# Patient Record
Sex: Male | Born: 1940
Health system: Southern US, Community
[De-identification: ages and names within clinical notes are randomized; demographics above are authoritative.]

## PROBLEM LIST (undated history)

## (undated) DIAGNOSIS — R55 Syncope and collapse: Secondary | ICD-10-CM

## (undated) DIAGNOSIS — N183 Chronic kidney disease, stage 3 unspecified: Secondary | ICD-10-CM

## (undated) DIAGNOSIS — K4090 Unilateral inguinal hernia, without obstruction or gangrene, not specified as recurrent: Secondary | ICD-10-CM

## (undated) DIAGNOSIS — R079 Chest pain, unspecified: Secondary | ICD-10-CM

## (undated) DIAGNOSIS — H269 Unspecified cataract: Secondary | ICD-10-CM

## (undated) DIAGNOSIS — E785 Hyperlipidemia, unspecified: Secondary | ICD-10-CM

## (undated) DIAGNOSIS — R001 Bradycardia, unspecified: Secondary | ICD-10-CM

## (undated) HISTORY — DX: Unspecified cataract: H26.9

## (undated) HISTORY — DX: Syncope and collapse: R55

## (undated) HISTORY — DX: Chest pain, unspecified: R07.9

## (undated) HISTORY — PX: TONSILLECTOMY: SUR1361

## (undated) HISTORY — DX: Unilateral inguinal hernia, without obstruction or gangrene, not specified as recurrent: K40.90

## (undated) HISTORY — PX: HERNIA REPAIR: SHX51

## (undated) HISTORY — DX: Hyperlipidemia, unspecified: E78.5

## (undated) HISTORY — PX: ROTATOR CUFF REPAIR: SHX139

## (undated) HISTORY — DX: Bradycardia, unspecified: R00.1

## (undated) HISTORY — PX: CATARACT EXTRACTION, BILATERAL: SHX1313

## (undated) HISTORY — DX: Chronic kidney disease, stage 3 unspecified: N18.30

## (undated) HISTORY — DX: Chronic kidney disease, stage 3 (moderate): N18.3

---

## 2005-04-03 ENCOUNTER — Encounter: Admission: RE | Admit: 2005-04-03 | Discharge: 2005-04-03 | Payer: Self-pay | Admitting: Internal Medicine

## 2005-04-18 ENCOUNTER — Ambulatory Visit (HOSPITAL_COMMUNITY): Admission: RE | Admit: 2005-04-18 | Discharge: 2005-04-18 | Payer: Self-pay | Admitting: Gastroenterology

## 2005-06-26 ENCOUNTER — Emergency Department (HOSPITAL_COMMUNITY): Admission: EM | Admit: 2005-06-26 | Discharge: 2005-06-26 | Payer: Self-pay | Admitting: Family Medicine

## 2009-05-05 ENCOUNTER — Ambulatory Visit (HOSPITAL_BASED_OUTPATIENT_CLINIC_OR_DEPARTMENT_OTHER): Admission: RE | Admit: 2009-05-05 | Discharge: 2009-05-05 | Payer: Self-pay | Admitting: Internal Medicine

## 2009-05-05 ENCOUNTER — Ambulatory Visit: Payer: Self-pay | Admitting: Interventional Radiology

## 2009-05-05 ENCOUNTER — Encounter: Payer: Self-pay | Admitting: Cardiology

## 2009-05-05 ENCOUNTER — Ambulatory Visit: Payer: Self-pay | Admitting: Internal Medicine

## 2009-05-05 DIAGNOSIS — R071 Chest pain on breathing: Secondary | ICD-10-CM | POA: Insufficient documentation

## 2009-05-05 DIAGNOSIS — R5381 Other malaise: Secondary | ICD-10-CM

## 2009-05-05 DIAGNOSIS — K219 Gastro-esophageal reflux disease without esophagitis: Secondary | ICD-10-CM

## 2009-05-05 DIAGNOSIS — E785 Hyperlipidemia, unspecified: Secondary | ICD-10-CM | POA: Insufficient documentation

## 2009-05-05 DIAGNOSIS — R5383 Other fatigue: Secondary | ICD-10-CM

## 2009-05-05 DIAGNOSIS — E039 Hypothyroidism, unspecified: Secondary | ICD-10-CM | POA: Insufficient documentation

## 2009-05-05 LAB — CONVERTED CEMR LAB
ALT: 19 units/L (ref 0–53)
AST: 18 units/L (ref 0–37)
Albumin: 4.8 g/dL (ref 3.5–5.2)
Alkaline Phosphatase: 29 units/L — ABNORMAL LOW (ref 39–117)
BUN: 22 mg/dL (ref 6–23)
Basophils Absolute: 0 10*3/uL (ref 0.0–0.1)
Basophils Relative: 1 % (ref 0–1)
Bilirubin, Direct: 0.2 mg/dL (ref 0.0–0.3)
CO2: 20 meq/L (ref 19–32)
Calcium: 9.9 mg/dL (ref 8.4–10.5)
Chloride: 104 meq/L (ref 96–112)
Cholesterol: 180 mg/dL (ref 0–200)
Creatinine, Ser: 1.36 mg/dL (ref 0.40–1.50)
Eosinophils Absolute: 0.1 10*3/uL (ref 0.0–0.7)
Eosinophils Relative: 1 % (ref 0–5)
Free T4: 1.26 ng/dL (ref 0.80–1.80)
Glucose, Bld: 93 mg/dL (ref 70–99)
HCT: 49.1 % (ref 39.0–52.0)
HDL: 29 mg/dL — ABNORMAL LOW (ref >39)
Hemoglobin: 16.5 g/dL (ref 13.0–17.0)
Indirect Bilirubin: 0.9 mg/dL (ref 0.0–0.9)
LDL Cholesterol: 91 mg/dL (ref 0–99)
Lymphocytes Relative: 30 % (ref 12–46)
Lymphs Abs: 1.4 10*3/uL (ref 0.7–4.0)
MCHC: 33.6 g/dL (ref 30.0–36.0)
MCV: 96.1 fL (ref 78.0–100.0)
Monocytes Absolute: 0.5 10*3/uL (ref 0.1–1.0)
Monocytes Relative: 11 % (ref 3–12)
Neutro Abs: 2.6 10*3/uL (ref 1.7–7.7)
Neutrophils Relative %: 57 % (ref 43–77)
Platelets: 260 10*3/uL (ref 150–400)
Potassium: 4.5 meq/L (ref 3.5–5.3)
RBC: 5.11 M/uL (ref 4.22–5.81)
RDW: 12.7 % (ref 11.5–15.5)
Sodium: 141 meq/L (ref 135–145)
TSH: 3.457 microintl units/mL (ref 0.350–4.500)
Total Bilirubin: 1.1 mg/dL (ref 0.3–1.2)
Total CHOL/HDL Ratio: 6.2
Total Protein: 7.3 g/dL (ref 6.0–8.3)
Triglycerides: 298 mg/dL — ABNORMAL HIGH (ref <150)
VLDL: 60 mg/dL — ABNORMAL HIGH (ref 0–40)
Vitamin B-12: 452 pg/mL (ref 211–911)
WBC: 4.6 10*3/uL (ref 4.0–10.5)

## 2009-05-05 IMAGING — CR DG CHEST 2V
2 series · 2 of 2 positions shown · non-contrast
Comparison: [DATE] sick

CLINICAL DATA: Chest pain

CHEST - 2 VIEW

[w chest pa]
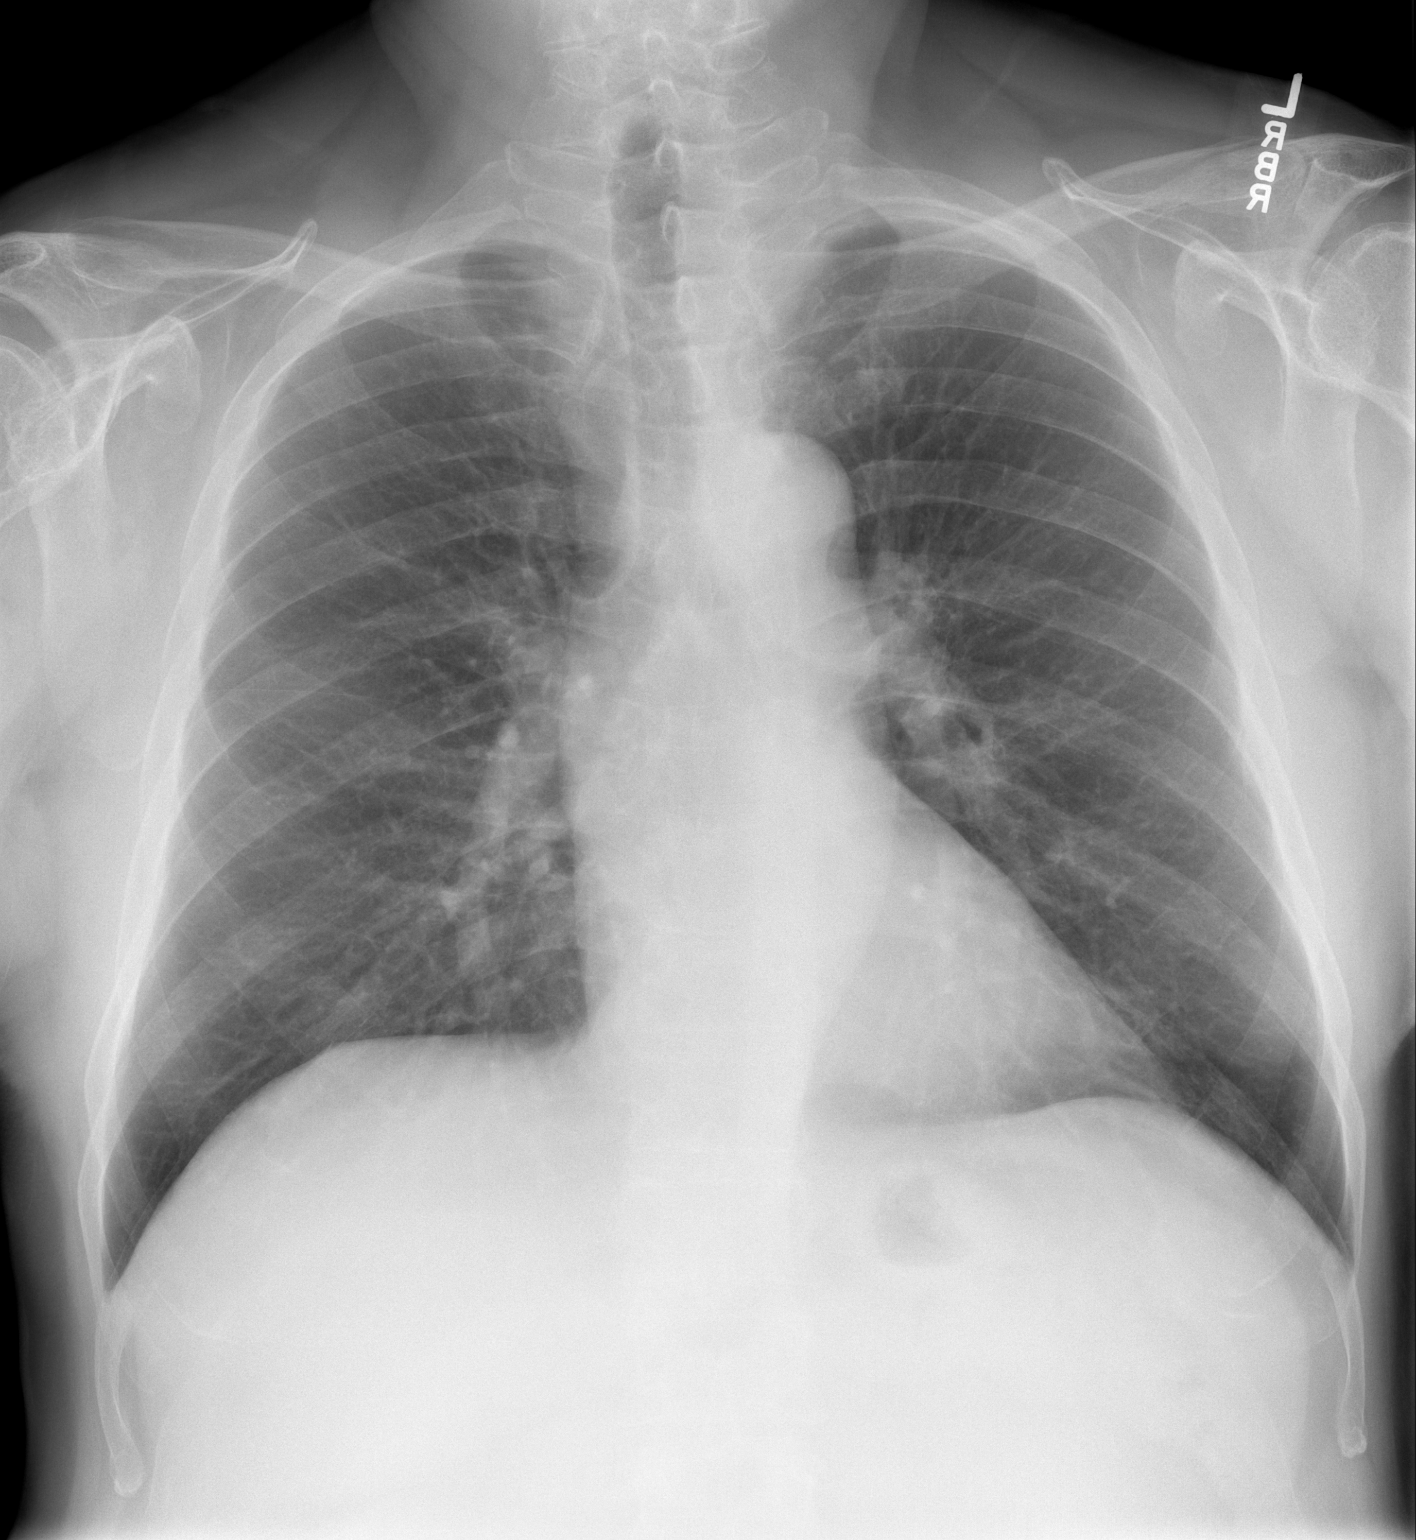

[w chest lat]
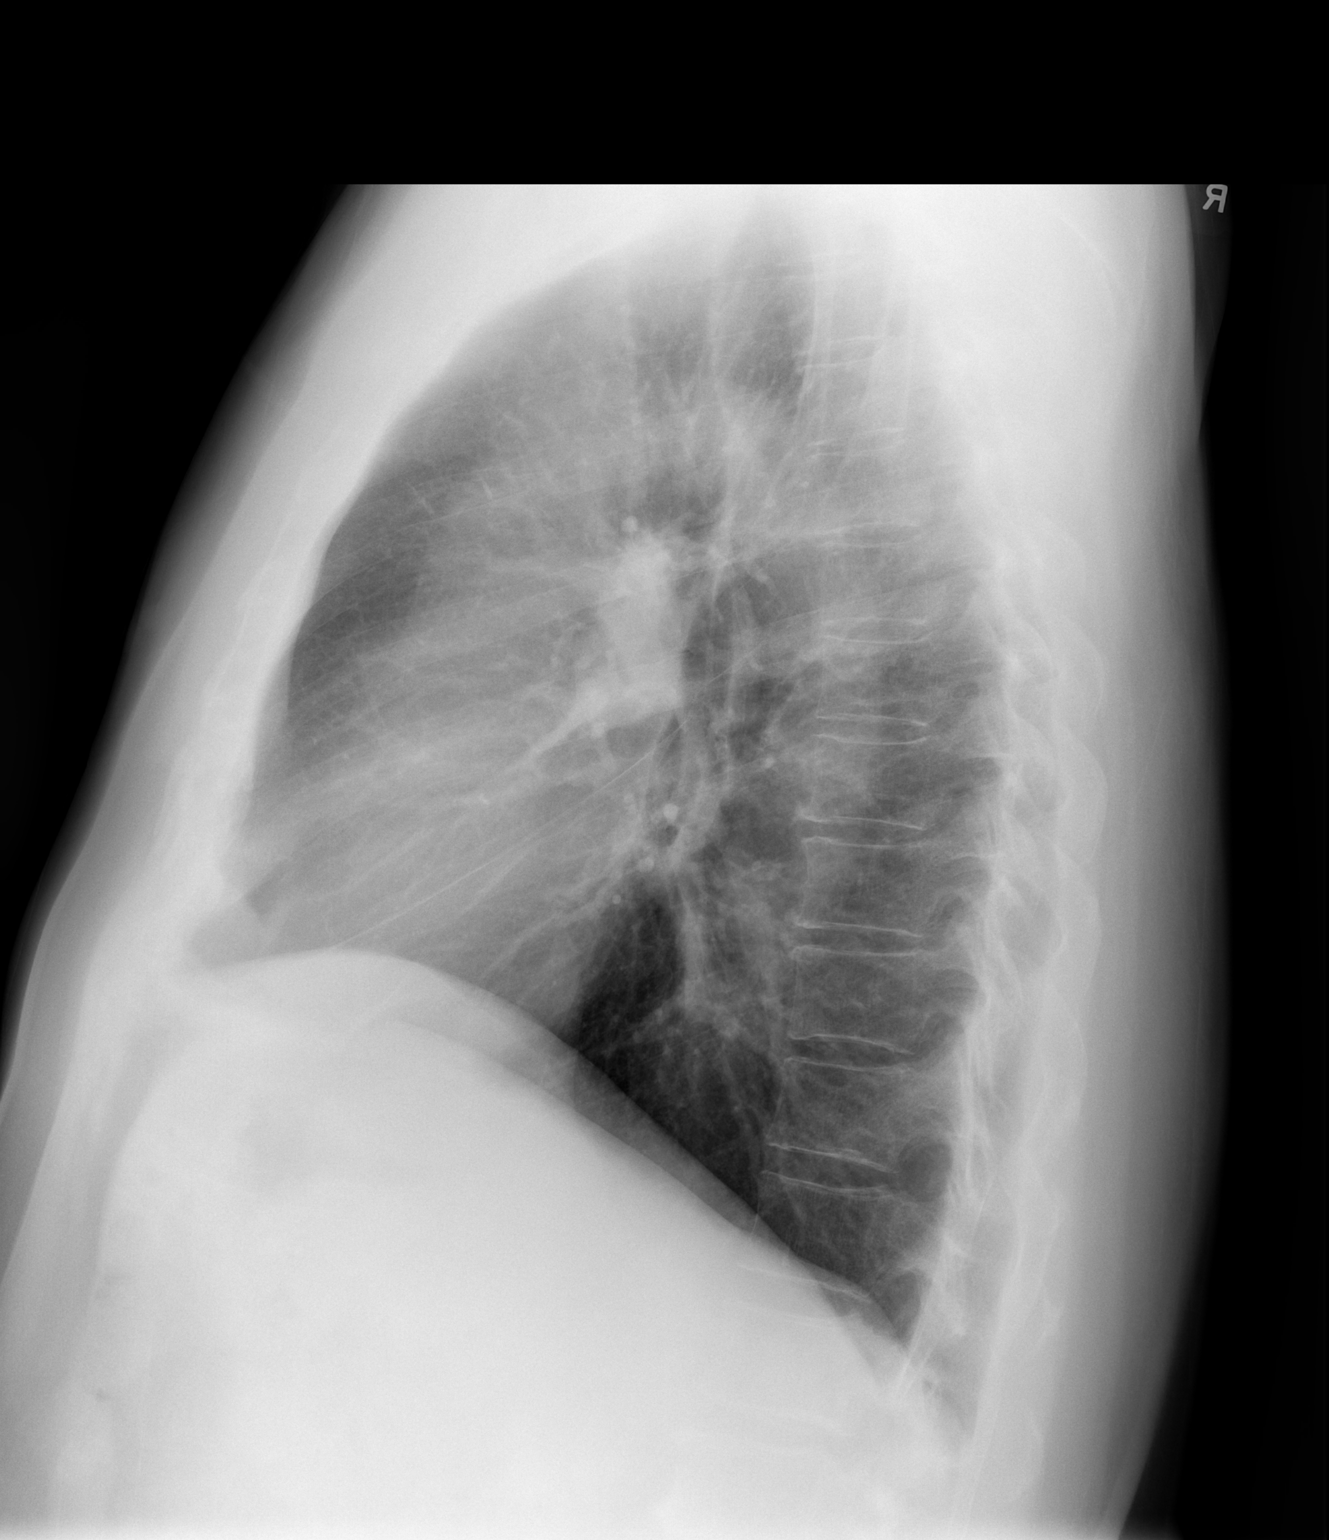

[2 of 2 positions shown; findings below may reference images not displayed]

FINDINGS: Mildly tortuous thoracic aorta. Lungs clear.  Heart size
and pulmonary vascularity normal.  No effusion.  Visualized bones
unremarkable.
IMPRESSION: No acute disease

## 2009-05-08 ENCOUNTER — Ambulatory Visit: Payer: Self-pay | Admitting: Cardiology

## 2009-05-08 DIAGNOSIS — R0789 Other chest pain: Secondary | ICD-10-CM

## 2009-05-15 ENCOUNTER — Encounter: Payer: Self-pay | Admitting: Internal Medicine

## 2009-05-15 ENCOUNTER — Telehealth: Payer: Self-pay | Admitting: Internal Medicine

## 2009-05-22 ENCOUNTER — Ambulatory Visit: Payer: Self-pay

## 2009-05-22 ENCOUNTER — Encounter: Payer: Self-pay | Admitting: Cardiology

## 2009-05-22 ENCOUNTER — Ambulatory Visit (HOSPITAL_COMMUNITY): Admission: RE | Admit: 2009-05-22 | Discharge: 2009-05-22 | Payer: Self-pay | Admitting: Cardiology

## 2009-05-22 ENCOUNTER — Ambulatory Visit: Payer: Self-pay | Admitting: Cardiovascular Disease

## 2009-06-13 ENCOUNTER — Ambulatory Visit: Payer: Self-pay | Admitting: Internal Medicine

## 2009-07-28 ENCOUNTER — Telehealth (INDEPENDENT_AMBULATORY_CARE_PROVIDER_SITE_OTHER): Payer: Self-pay | Admitting: *Deleted

## 2009-08-02 ENCOUNTER — Ambulatory Visit: Payer: Self-pay | Admitting: Gastroenterology

## 2009-08-04 ENCOUNTER — Ambulatory Visit: Payer: Self-pay | Admitting: Gastroenterology

## 2009-08-08 ENCOUNTER — Encounter: Payer: Self-pay | Admitting: Gastroenterology

## 2010-01-26 ENCOUNTER — Emergency Department (HOSPITAL_COMMUNITY): Admission: EM | Admit: 2010-01-26 | Discharge: 2010-01-26 | Payer: Self-pay | Admitting: Emergency Medicine

## 2010-07-20 ENCOUNTER — Encounter: Payer: Self-pay | Admitting: Gastroenterology

## 2010-07-31 NOTE — Procedures (Signed)
Summary: Upper Endoscopy  Patient: [name] Note: All result statuses are Final unless otherwise noted.  Tests: (1) Upper Endoscopy (EGD)   EGD Upper Endoscopy       DONE     [facility]     [address]     [place], [place]  [address]           ENDOSCOPY PROCEDURE REPORT           PATIENT:  Kyle Henderson, Kyle Henderson  MR#:  [id number]     BIRTHDATE:  1940-11-27, 68 yrs. old  GENDER:  male           ENDOSCOPIST:  Barbette Hair. Arlyce Dice, MD     Referred by:  Thomos Lemons, DO           PROCEDURE DATE:  08/04/2009     PROCEDURE:  EGD with biopsy     ASA CLASS:  Class II     INDICATIONS:  chest pain           MEDICATIONS:   Fentanyl 25 mcg IV, Versed 4 mg IV, glycopyrrolate     (Robinal) 0.2 mg IV, 0.6cc simethancone 0.6 cc PO     TOPICAL ANESTHETIC:  Exactacain Spray           DESCRIPTION OF PROCEDURE:   After the risks benefits and     alternatives of the procedure were thoroughly explained, informed     consent was obtained.  The LB GIF-H180 [id number] endoscope was     introduced through the mouth and advanced to the third portion of     the duodenum, without limitations.  The instrument was slowly     withdrawn as the mucosa was fully examined.     <<PROCEDUREIMAGES>>           irregular Z-line at the gastroesophageal junction. Slight     irregularity (see image2 and image3). Bxs taken to r/o Barrett's     esophagus  Otherwise the examination was normal.    Retroflexed     views revealed no abnormalities.    The scope was then withdrawn     from the patient and the procedure completed.           COMPLICATIONS:  None           ENDOSCOPIC IMPRESSION:     1) Irregular Z-line at the gastroesophageal junction     2) Otherwise normal examination           Findings may reflect chronic acid reflux, which may explain chest     pain.  Chest wall pain is not r/oed           RECOMMENDATIONS:     1) continue current medications - raniditine     2) Call office next 2-3 days to schedule an  office appointment     for 1 month           REPEAT EXAM:  No           ______________________________     Barbette Hair. Arlyce Dice, MD           CC:           n.     eSIGNED:   Barbette Hair. Pasqual Farias at 08/04/2009 12:35 PM           Kyle Henderson, [id number]  Note: An exclamation mark (!) indicates a result that was not dispersed into the flowsheet. Document Creation Date: 08/04/2009 12:36 PM _______________________________________________________________________  Marland Kitchen  1) Order result status: Final Collection or observation date-time: 08/04/2009 12:31 Requested date-time:  Receipt date-time:  Reported date-time:  Referring Physician:   Ordering Physician: Melvia Heaps [phone]) Specimen Source:  Source: Launa Grill Order Number: [id number] Lab site:   Appended Document: Upper Endoscopy     Procedures Next Due Date:    EGD: 08/2010

## 2010-07-31 NOTE — Assessment & Plan Note (Signed)
Summary: hx gerd...em   History of Present Illness Visit Type: Initial Consult Primary GI MD: Melvia Heaps MD [facility] Primary Provider: Thomos Lemons, MD Chief Complaint: Patient here for evaluation of GERD symptoms. He c/o left sided chest discomfort at times. He denies any increased belching  or any odynophagia. No nausea or vomiting. History of Present Illness:   [name] is a pleasant 70 year old white male referred at the request of Dr. Artist Pais for evaluation of chest pain.  He has had several episodes of short-lived left upper chest pain at rest.  This may occur for minutes at a time and is described as mild and aching.  He underwent cardiac evaluation including EKG and stress testing which were negative.  For the past month he has had only one episode of pain.  He started ranitidine at that time.  He has a past history of moderately severe pyrosis when he was heavier.  He denies nausea, dysphagia, sore throat or coughing.  Colonoscopy in 2007 was normal   GI Review of Systems    Reports abdominal pain, acid reflux, bloating, chest pain, and  heartburn.     Location of  Abdominal pain: lower abdomen.    Denies belching, dysphagia with liquids, dysphagia with solids, loss of appetite, nausea, vomiting, vomiting blood, weight loss, and  weight gain.      Reports diverticulosis.     Denies anal fissure, black tarry stools, change in bowel habit, constipation, diarrhea, fecal incontinence, heme positive stool, hemorrhoids, irritable bowel syndrome, jaundice, light color stool, liver problems, rectal bleeding, and  rectal pain. Preventive Screening-Counseling & Management  Alcohol-Tobacco     Smoking Status: never  Caffeine-Diet-Exercise     Does Patient Exercise: yes      Drug Use:  no.      Current Medications (verified): 1)  Aspirin Ec 81 Mg Tbec (Aspirin) .... One By Mouth Once Daily 2)  Ranitidine Hcl 150 Mg Tabs (Ranitidine Hcl) .... One By Mouth Two Times A Day  Allergies  (verified): No Known Drug Allergies  Past History:  Past Medical History: Reviewed history from 06/13/2009 and no changes required. Current Problems:  FATIGUE, CHRONIC (ICD-780.79) CHEST WALL PAIN, ACUTE (ICD-786.52) FAMILY HISTORY DIABETES 1ST DEGREE RELATIVE (ICD-V18.0) FAMILY HISTORY OF CAD MALE 1ST DEGREE RELATIVE <50 (ICD-V17.3) FAMILY HISTORY OF CAD MALE 1ST DEGREE RELATIVE <60 (ICD-V16.49) HYPOTHYROIDISM (ICD-244.9)  HYPERLIPIDEMIA (ICD-272.4) GERD (ICD-530.81)  Past Surgical History: inguinal herniograpphy Right rotator cuff repair Tonsillectomy Right Hand Surgery  Family History: Father Heart failure  No FH of Colon Cancer: Family History of Diabetes: Maternal Grandmother, Maternal Grandfather, Paternal Grandmother, Paternal Emelia Loron, Cousins x 2 Family History of Heart Disease: Mother, Father, Maternal Grandmother, Maternal Grandfather, Brother  Social History: Retired  Alcohol Use - yes-occasional Illicit Drug Use - no Patient has never smoked.  Patient gets regular exercise. Drug Use:  no  Review of Systems       The patient complains of depression-new, fatigue, and sleeping problems.  The patient denies allergy/sinus, anemia, anxiety-new, arthritis/joint pain, back pain, blood in urine, breast changes/lumps, change in vision, confusion, cough, coughing up blood, fainting, fever, headaches-new, hearing problems, heart murmur, heart rhythm changes, itching, muscle pains/cramps, night sweats, nosebleeds, pregnancy symptoms, shortness of breath, skin rash, sore throat, swelling of feet/legs, swollen lymph glands, thirst - excessive , urination - excessive , urination changes/pain, urine leakage, vision changes, and voice change.         All other systems were reviewed and were negative  Vital Signs:  Patient profile:   70 year old male Height:      69 inches Weight:      196.38 pounds BMI:     29.11 BSA:     2.05 Pulse rate:   60 / minute Pulse  rhythm:   regular BP sitting:   106 / 66  (left arm)  Vitals Entered By: Hortense Ramal CMA Duncan Dull) (August 02, 2009 11:05 AM)  Physical Exam  Additional Exam:  He is a healthy-appearing male  skin: anicteric HEENT: normocephalic; PEERLA; no nasal or pharyngeal abnormalities neck: supple nodes: no cervical lymphadenopathy chest: clear to ausculatation and percussion; there is no chest wall tenderness to palpation heart: no murmurs, gallops, or rubs abd: soft, nontender; BS normoactive; no abdominal masses, tenderness, organomegaly rectal: deferred ext: no cynanosis, clubbing, edema skeletal: no deformities neuro: oriented x 3; no focal abnormalities    Impression & Recommendations:  Problem # 1:  CHEST PAIN, ATYPICAL (ICD-786.59)  Chest pain could be due to GERD or musculoskeletal pain.  With his history of GERD esophagitis and Barrett's should be ruled out.  Recommendations #1 continue ranitidine #2 upper endoscopy  Risks, alternatives, and complications of the procedure, including bleeding, perforation, and possible need for surgery, were explained to the patient.  Patient's questions were answered.  Orders: EGD (EGD)  Patient Instructions: 1)  Conscious Sedation brochure given.  2)  Upper Endoscopy brochure given.  3)  Your EGD is scheduled for 08/04/2009 at 3pm 4)  The medication list was reviewed and reconciled.  All changed / newly prescribed medications were explained.  A complete medication list was provided to the patient / caregiver.

## 2010-07-31 NOTE — Letter (Signed)
Summary: EGD Instructions  [facility]  [address] [place], [place] [address]   Phone: [phone]  Fax: [phone]       Kyle Henderson    [birth year]    MRN: [id number]       Procedure Day /Date:FRIDAY 08/04/2009     Arrival Time: 2PM     Procedure Time:3PM     Location of Procedure:                    X  [facility] (4th Floor)   PREPARATION FOR ENDOSCOPY   On 08/04/2009 THE DAY OF THE PROCEDURE:  1.   No solid foods, milk or milk products are allowed after midnight the night before your procedure.  2.   Do not drink anything colored red or purple.  Avoid juices with pulp.  No orange juice.  3.  You may drink clear liquids until 1:00PM, which is 2 hours before your procedure.                                                                                                CLEAR LIQUIDS INCLUDE: Water Jello Ice Popsicles Tea (sugar ok, no milk/cream) Powdered fruit flavored drinks Coffee (sugar ok, no milk/cream) Gatorade Juice: apple, white grape, white cranberry  Lemonade Clear bullion, consomm, broth Carbonated beverages (any kind) Strained chicken noodle soup Hard Candy   MEDICATION INSTRUCTIONS  Unless otherwise instructed, you should take regular prescription medications with a small sip of water as early as possible the morning of your procedure.              OTHER INSTRUCTIONS  You will need a responsible adult at least 72 years of age to accompany you and drive you home.   This person must remain in the waiting room during your procedure.  Wear loose fitting clothing that is easily removed.  Leave jewelry and other valuables at home.  However, you may wish to bring a book to read or an iPod/MP3 player to listen to music as you wait for your procedure to start.  Remove all body piercing jewelry and leave at home.  Total time from sign-in until discharge is approximately 2-3 hours.  You should go home directly after  your procedure and rest.  You can resume normal activities the day after your procedure.  The day of your procedure you should not:   Drive   Make legal decisions   Operate machinery   Drink alcohol   Return to work  You will receive specific instructions about eating, activities and medications before you leave.    The above instructions have been reviewed and explained to me by   _______________________    I fully understand and can verbalize these instructions _____________________________ Date _________

## 2010-07-31 NOTE — Progress Notes (Signed)
  Phone Note Call from Patient   Caller: PT Initial call taken by: Denny Peon    mailed ROI out to Pt. todayKimberly Henderson  July 28, 2009 3:49 PM

## 2010-07-31 NOTE — Letter (Signed)
Summary: Patient Notice-[facility]  [address] [place], [place] [address]   Phone: [phone]  Fax: [phone]        August 08, 2009 MRN: [id number]    LAMARIO MANI [address] [place], [place]  [address]    Dear Mr. Herrod,  I am pleased to inform you that the biopsies taken during your recent endoscopic examination did not show any evidence of cancer upon pathologic examination.  However, your biopsies indicate you have a condition known as Barrett's esophagus. While not cancer, it is pre-cancerous (can progress to cancer) and needs to be monitored with repeat endoscopic examination and biopsies.  Fortunately, it is quite rare that this develops into cancer, but careful monitoring of the condition along with taking your medication as prescribed is important in reducing the risk of developing cancer.  It is my recommendation that you have a repeat upper gastrointestinal endoscopic examination in 1_ years.  Additional information/recommendations:  __Please call [phone] to schedule a return visit to further      evaluate your condition.  _x_Continue with treatment plan as outlined the day of your exam.  Please call us if you have or develop heartburn, reflux symptoms, any swallowing problems, or if you have questions about your condition that have not been fully answered at this time.  Sincerely,  Louis Meckel MD  This letter has been electronically signed by your physician.  Appended Document: Patient Notice-[facility] Letter mailed 2.10.11

## 2010-08-02 ENCOUNTER — Encounter: Payer: Self-pay | Admitting: Gastroenterology

## 2010-08-08 NOTE — Letter (Signed)
Summary: Endoscopy Letter  [facility]  [address] [place], [place] [address]   Phone: [phone]  Fax: [phone]      August 02, 2010 MRN: [id number]   Kyle Henderson [address] [place], [place]  [address]   Dear [name],   According to your medical record, it is time for you to schedule an Endoscopy. Endoscopic screening is recommended for patients with certain upper digestive tract conditions because of associated increased risk for cancers of the upper digestive system.  This letter has been generated based on the recommendations made at the time of your prior procedure. If you feel that in your particular situation this may no longer apply, please contact our office.  Please call our office at [phone]) to schedule this appointment or to update your records at your earliest convenience.  Thank you for cooperating with [place] to provide you with the very best care possible.   Sincerely,  Barbette Hair. Arlyce Dice, M.D.  [facility] [phone]

## 2010-08-16 NOTE — Procedures (Signed)
Summary: Recall / [facility]  Recall / [facility]   Imported By: Lennie Odor 08/07/2010 11:37:51  _____________________________________________________________________  External Attachment:    Type:   Image     Comment:   External Document

## 2010-09-15 LAB — DIFFERENTIAL
Basophils Absolute: 0 10*3/uL (ref 0.0–0.1)
Basophils Relative: 0 % (ref 0–1)
Eosinophils Absolute: 0 10*3/uL (ref 0.0–0.7)
Eosinophils Relative: 1 % (ref 0–5)
Lymphocytes Relative: 23 % (ref 12–46)
Lymphs Abs: 1.6 10*3/uL (ref 0.7–4.0)
Monocytes Absolute: 0.7 10*3/uL (ref 0.1–1.0)
Monocytes Relative: 11 % (ref 3–12)
Neutro Abs: 4.5 10*3/uL (ref 1.7–7.7)
Neutrophils Relative %: 66 % (ref 43–77)

## 2010-09-15 LAB — CBC
HCT: 44.8 % (ref 39.0–52.0)
Hemoglobin: 15.6 g/dL (ref 13.0–17.0)
MCH: 32.7 pg (ref 26.0–34.0)
MCHC: 34.9 g/dL (ref 30.0–36.0)
MCV: 93.8 fL (ref 78.0–100.0)
Platelets: 190 10*3/uL (ref 150–400)
RBC: 4.78 MIL/uL (ref 4.22–5.81)
RDW: 12.7 % (ref 11.5–15.5)
WBC: 6.9 10*3/uL (ref 4.0–10.5)

## 2010-09-15 LAB — BASIC METABOLIC PANEL
BUN: 25 mg/dL — ABNORMAL HIGH (ref 6–23)
CO2: 27 mEq/L (ref 19–32)
Calcium: 9.5 mg/dL (ref 8.4–10.5)
Chloride: 108 mEq/L (ref 96–112)
Creatinine, Ser: 1.33 mg/dL (ref 0.4–1.5)
GFR calc Af Amer: >60 mL/min (ref >60)
GFR calc non Af Amer: 53 mL/min — ABNORMAL LOW (ref >60)
Glucose, Bld: 110 mg/dL — ABNORMAL HIGH (ref 70–99)
Potassium: 4.3 mEq/L (ref 3.5–5.1)
Sodium: 140 mEq/L (ref 135–145)

## 2010-09-15 LAB — POCT CARDIAC MARKERS
CKMB, poc: <1 ng/mL — ABNORMAL LOW (ref 1.0–8.0)
Myoglobin, poc: 100 ng/mL (ref 12–200)
Troponin i, poc: <0.05 ng/mL (ref 0.00–0.09)

## 2012-03-17 ENCOUNTER — Encounter: Payer: Self-pay | Admitting: Gastroenterology

## 2012-04-24 ENCOUNTER — Telehealth: Payer: Self-pay | Admitting: Family Medicine

## 2012-04-24 NOTE — Telephone Encounter (Signed)
Pt requesting transfer from [place] to [place]. Waiting on response and will call pt back. Just recently went on [facility].

## 2013-05-14 ENCOUNTER — Telehealth: Payer: Self-pay | Admitting: Internal Medicine

## 2013-05-14 DIAGNOSIS — H269 Unspecified cataract: Secondary | ICD-10-CM

## 2013-05-14 NOTE — Telephone Encounter (Signed)
Pt needs a referral sent for s/p  Care for his cataract removal. Dr. Rubye Oaks is the handling the post-op care.

## 2013-05-14 NOTE — Telephone Encounter (Signed)
Ok for referral but it has been greater than 1 year since prev OV.  I suggest he make appt for CPX or routine follow up visit.

## 2013-05-18 NOTE — Telephone Encounter (Signed)
I spoke to pt and he states he has another provider as his PCP and is under that physician's care.

## 2013-05-18 NOTE — Telephone Encounter (Signed)
Referral order placed, please call and schedule cpx

## 2014-10-03 DIAGNOSIS — Z1389 Encounter for screening for other disorder: Secondary | ICD-10-CM | POA: Diagnosis not present

## 2014-10-03 DIAGNOSIS — Z0000 Encounter for general adult medical examination without abnormal findings: Secondary | ICD-10-CM | POA: Diagnosis not present

## 2014-10-03 DIAGNOSIS — R5383 Other fatigue: Secondary | ICD-10-CM | POA: Diagnosis not present

## 2014-10-03 DIAGNOSIS — N183 Chronic kidney disease, stage 3 (moderate): Secondary | ICD-10-CM | POA: Diagnosis not present

## 2014-10-03 DIAGNOSIS — E784 Other hyperlipidemia: Secondary | ICD-10-CM | POA: Diagnosis not present

## 2014-10-03 DIAGNOSIS — Z125 Encounter for screening for malignant neoplasm of prostate: Secondary | ICD-10-CM | POA: Diagnosis not present

## 2014-10-03 DIAGNOSIS — G479 Sleep disorder, unspecified: Secondary | ICD-10-CM | POA: Diagnosis not present

## 2015-10-27 DIAGNOSIS — N183 Chronic kidney disease, stage 3 (moderate): Secondary | ICD-10-CM | POA: Diagnosis not present

## 2015-10-27 DIAGNOSIS — Z1389 Encounter for screening for other disorder: Secondary | ICD-10-CM | POA: Diagnosis not present

## 2015-10-27 DIAGNOSIS — E784 Other hyperlipidemia: Secondary | ICD-10-CM | POA: Diagnosis not present

## 2015-10-27 DIAGNOSIS — Z0000 Encounter for general adult medical examination without abnormal findings: Secondary | ICD-10-CM | POA: Diagnosis not present

## 2015-11-17 ENCOUNTER — Encounter: Payer: Self-pay | Admitting: Gastroenterology

## 2016-01-10 ENCOUNTER — Emergency Department (HOSPITAL_BASED_OUTPATIENT_CLINIC_OR_DEPARTMENT_OTHER): Payer: Commercial Managed Care - HMO

## 2016-01-10 ENCOUNTER — Emergency Department (HOSPITAL_BASED_OUTPATIENT_CLINIC_OR_DEPARTMENT_OTHER)
Admission: EM | Admit: 2016-01-10 | Discharge: 2016-01-10 | Disposition: A | Discharge status: Home / Self Care | Payer: Commercial Managed Care - HMO | Attending: Emergency Medicine | Admitting: Emergency Medicine

## 2016-01-10 ENCOUNTER — Encounter (HOSPITAL_BASED_OUTPATIENT_CLINIC_OR_DEPARTMENT_OTHER): Payer: Self-pay | Admitting: *Deleted

## 2016-01-10 DIAGNOSIS — S01511A Laceration without foreign body of lip, initial encounter: Secondary | ICD-10-CM | POA: Insufficient documentation

## 2016-01-10 DIAGNOSIS — S060X1A Concussion with loss of consciousness of 30 minutes or less, initial encounter: Secondary | ICD-10-CM | POA: Diagnosis not present

## 2016-01-10 DIAGNOSIS — Y929 Unspecified place or not applicable: Secondary | ICD-10-CM | POA: Insufficient documentation

## 2016-01-10 DIAGNOSIS — Y999 Unspecified external cause status: Secondary | ICD-10-CM | POA: Insufficient documentation

## 2016-01-10 DIAGNOSIS — Y9302 Activity, running: Secondary | ICD-10-CM | POA: Diagnosis not present

## 2016-01-10 DIAGNOSIS — W0110XA Fall on same level from slipping, tripping and stumbling with subsequent striking against unspecified object, initial encounter: Secondary | ICD-10-CM | POA: Insufficient documentation

## 2016-01-10 DIAGNOSIS — R55 Syncope and collapse: Secondary | ICD-10-CM | POA: Diagnosis not present

## 2016-01-10 MED ORDER — LIDOCAINE HCL (PF) 1 % IJ SOLN
5.0000 mL | Freq: Once | INTRAMUSCULAR | Status: AC
  Administered 2016-01-10: 5 mL
  Filled 2016-01-10: qty 5

## 2016-01-10 NOTE — Discharge Instructions (Signed)
Facial Laceration   A facial laceration is a cut on the face. These injuries can be painful and cause bleeding. Lacerations usually heal quickly, but they need special care to reduce scarring.  DIAGNOSIS   Your health care provider will take a medical history, ask for details about how the injury occurred, and examine the wound to determine how deep the cut is.  TREATMENT   Some facial lacerations may not require closure. Others may not be able to be closed because of an increased risk of infection. The risk of infection and the chance for successful closure will depend on various factors, including the amount of time since the injury occurred.  The wound may be cleaned to help prevent infection. If closure is appropriate, pain medicines may be given if needed. Your health care provider will use stitches (sutures), wound glue (adhesive), or skin adhesive strips to repair the laceration. These tools bring the skin edges together to allow for faster healing and a better cosmetic outcome. If needed, you may also be given a tetanus shot.  HOME CARE INSTRUCTIONS  · Only take over-the-counter or prescription medicines as directed by your health care provider.  · Follow your health care provider's instructions for wound care. These instructions will vary depending on the technique used for closing the wound.  For Sutures:  · Keep the wound clean and dry.    · If you were given a bandage (dressing), you should change it at least once a day. Also change the dressing if it becomes wet or dirty, or as directed by your health care provider.    · Wash the wound with soap and water 2 times a day. Rinse the wound off with water to remove all soap. Pat the wound dry with a clean towel.    · After cleaning, apply a thin layer of the antibiotic ointment recommended by your health care provider. This will help prevent infection and keep the dressing from sticking.    · You may shower as usual after the first 24 hours. Do not soak the  wound in water until the sutures are removed.    · Get your sutures removed as directed by your health care provider. With facial lacerations, sutures should usually be taken out after 4-5 days to avoid stitch marks.    · Wait a few days after your sutures are removed before applying any makeup.  For Skin Adhesive Strips:  · Keep the wound clean and dry.    · Do not get the skin adhesive strips wet. You may bathe carefully, using caution to keep the wound dry.    · If the wound gets wet, pat it dry with a clean towel.    · Skin adhesive strips will fall off on their own. You may trim the strips as the wound heals. Do not remove skin adhesive strips that are still stuck to the wound. They will fall off in time.    For Wound Adhesive:  · You may briefly wet your wound in the shower or bath. Do not soak or scrub the wound. Do not swim. Avoid periods of heavy sweating until the skin adhesive has fallen off on its own. After showering or bathing, gently pat the wound dry with a clean towel.    · Do not apply liquid medicine, cream medicine, ointment medicine, or makeup to your wound while the skin adhesive is in place. This may loosen the film before your wound is healed.    · If a dressing is placed over the wound, be careful   not to apply tape directly over the skin adhesive. This may cause the adhesive to be pulled off before the wound is healed.    · Avoid prolonged exposure to sunlight or tanning lamps while the skin adhesive is in place.  · The skin adhesive will usually remain in place for 5-10 days, then naturally fall off the skin. Do not pick at the adhesive film.    After Healing:  Once the wound has healed, cover the wound with sunscreen during the day for 1 full year. This can help minimize scarring. Exposure to ultraviolet light in the first year will darken the scar. It can take 1-2 years for the scar to lose its redness and to heal completely.   SEEK MEDICAL CARE IF:  · You have a fever.  SEEK IMMEDIATE  MEDICAL CARE IF:  · You have redness, pain, or swelling around the wound.    · You see a yellowish-white fluid (pus) coming from the wound.       This information is not intended to replace advice given to you by your health care provider. Make sure you discuss any questions you have with your health care provider.     Document Released: 07/25/2004 Document Revised: 07/08/2014 Document Reviewed: 01/28/2013  Elsevier Interactive Patient Education ©2016 [employer]      Concussion, Adult  A concussion, or closed-head injury, is a brain injury caused by a direct blow to the head or by a quick and sudden movement (jolt) of the head or neck. Concussions are usually not life-threatening. Even so, the effects of a concussion can be serious. If you have had a concussion before, you are more likely to experience concussion-like symptoms after a direct blow to the head.   CAUSES  · Direct blow to the head, such as from running into another player during a soccer game, being hit in a fight, or hitting your head on a hard surface.  · A jolt of the head or neck that causes the brain to move back and forth inside the skull, such as in a car crash.  SIGNS AND SYMPTOMS  The signs of a concussion can be hard to notice. Early on, they may be missed by you, family members, and health care providers. You may look fine but act or feel differently.  Symptoms are usually temporary, but they may last for days, weeks, or even longer. Some symptoms may appear right away while others may not show up for hours or days. Every head injury is different. Symptoms include:  · Mild to moderate headaches that will not go away.  · A feeling of pressure inside your head.  · Having more trouble than usual:    Learning or remembering things you have heard.    Answering questions.    Paying attention or concentrating.    Organizing daily tasks.    Making decisions and solving problems.  · Slowness in thinking, acting or reacting, speaking, or  reading.  · Getting lost or being easily confused.  · Feeling tired all the time or lacking energy (fatigued).  · Feeling drowsy.  · Sleep disturbances.    Sleeping more than usual.    Sleeping less than usual.    Trouble falling asleep.    Trouble sleeping (insomnia).  · Loss of balance or feeling lightheaded or dizzy.  · Nausea or vomiting.  · Numbness or tingling.  · Increased sensitivity to:    Sounds.    Lights.    Distractions.  · Vision problems   or eyes that tire easily.  · Diminished sense of taste or smell.  · Ringing in the ears.  · Mood changes such as feeling sad or anxious.  · Becoming easily irritated or angry for little or no reason.  · Lack of motivation.  · Seeing or hearing things other people do not see or hear (hallucinations).  DIAGNOSIS  Your health care provider can usually diagnose a concussion based on a description of your injury and symptoms. He or she will ask whether you passed out (lost consciousness) and whether you are having trouble remembering events that happened right before and during your injury.  Your evaluation might include:  · A brain scan to look for signs of injury to the brain. Even if the test shows no injury, you may still have a concussion.  · Blood tests to be sure other problems are not present.  TREATMENT  · Concussions are usually treated in an emergency department, in urgent care, or at a clinic. You may need to stay in the hospital overnight for further treatment.  · Tell your health care provider if you are taking any medicines, including prescription medicines, over-the-counter medicines, and natural remedies. Some medicines, such as blood thinners (anticoagulants) and aspirin, may increase the chance of complications. Also tell your health care provider whether you have had alcohol or are taking illegal drugs. This information may affect treatment.  · Your health care provider will send you home with important instructions to follow.  · How fast you will  recover from a concussion depends on many factors. These factors include how severe your concussion is, what part of your brain was injured, your age, and how healthy you were before the concussion.  · Most people with mild injuries recover fully. Recovery can take time. In general, recovery is slower in older persons. Also, persons who have had a concussion in the past or have other medical problems may find that it takes longer to recover from their current injury.  HOME CARE INSTRUCTIONS  General Instructions  · Carefully follow the directions your health care provider gave you.  · Only take over-the-counter or prescription medicines for pain, discomfort, or fever as directed by your health care provider.  · Take only those medicines that your health care provider has approved.  · Do not drink alcohol until your health care provider says you are well enough to do so. Alcohol and certain other drugs may slow your recovery and can put you at risk of further injury.  · If it is harder than usual to remember things, write them down.  · If you are easily distracted, try to do one thing at a time. For example, do not try to watch TV while fixing dinner.  · Talk with family members or close friends when making important decisions.  · Keep all follow-up appointments. Repeated evaluation of your symptoms is recommended for your recovery.  · Watch your symptoms and tell others to do the same. Complications sometimes occur after a concussion. Older adults with a brain injury may have a higher risk of serious complications, such as a blood clot on the brain.  · Tell your teachers, school nurse, school counselor, coach, athletic trainer, or work manager about your injury, symptoms, and restrictions. Tell them about what you can or cannot do. They should watch for:    Increased problems with attention or concentration.    Increased difficulty remembering or learning new information.    Increased time needed   to complete tasks  or assignments.    Increased irritability or decreased ability to cope with stress.    Increased symptoms.  · Rest. Rest helps the brain to heal. Make sure you:    Get plenty of sleep at night. Avoid staying up late at night.    Keep the same bedtime hours on weekends and weekdays.    Rest during the day. Take daytime naps or rest breaks when you feel tired.  · Limit activities that require a lot of thought or concentration. These include:    Doing homework or job-related work.    Watching TV.    Working on the computer.  · Avoid any situation where there is potential for another head injury (football, hockey, soccer, basketball, martial arts, downhill snow sports and horseback riding). Your condition will get worse every time you experience a concussion. You should avoid these activities until you are evaluated by the appropriate follow-up health care providers.  Returning To Your Regular Activities  You will need to return to your normal activities slowly, not all at once. You must give your body and brain enough time for recovery.  · Do not return to sports or other athletic activities until your health care provider tells you it is safe to do so.  · Ask your health care provider when you can drive, ride a bicycle, or operate heavy machinery. Your ability to react may be slower after a brain injury. Never do these activities if you are dizzy.  · Ask your health care provider about when you can return to work or school.  Preventing Another Concussion  It is very important to avoid another brain injury, especially before you have recovered. In rare cases, another injury can lead to permanent brain damage, brain swelling, or death. The risk of this is greatest during the first 7-10 days after a head injury. Avoid injuries by:  · Wearing a seat belt when riding in a car.  · Drinking alcohol only in moderation.  · Wearing a helmet when biking, skiing, skateboarding, skating, or doing similar activities.  · Avoiding  activities that could lead to a second concussion, such as contact or recreational sports, until your health care provider says it is okay.  · Taking safety measures in your home.    Remove clutter and tripping hazards from floors and stairways.    Use grab bars in bathrooms and handrails by stairs.    Place non-slip mats on floors and in bathtubs.    Improve lighting in dim areas.  SEEK MEDICAL CARE IF:  · You have increased problems paying attention or concentrating.  · You have increased difficulty remembering or learning new information.  · You need more time to complete tasks or assignments than before.  · You have increased irritability or decreased ability to cope with stress.  · You have more symptoms than before.  Seek medical care if you have any of the following symptoms for more than 2 weeks after your injury:  · Lasting (chronic) headaches.  · Dizziness or balance problems.  · Nausea.  · Vision problems.  · Increased sensitivity to noise or light.  · Depression or mood swings.  · Anxiety or irritability.  · Memory problems.  · Difficulty concentrating or paying attention.  · Sleep problems.  · Feeling tired all the time.  SEEK IMMEDIATE MEDICAL CARE IF:  · You have severe or worsening headaches. These may be a sign of a blood clot in the   brain.  · You have weakness (even if only in one hand, leg, or part of the face).  · You have numbness.  · You have decreased coordination.  · You vomit repeatedly.  · You have increased sleepiness.  · One pupil is larger than the other.  · You have convulsions.  · You have slurred speech.  · You have increased confusion. This may be a sign of a blood clot in the brain.  · You have increased restlessness, agitation, or irritability.  · You are unable to recognize people or places.  · You have neck pain.  · It is difficult to wake you up.  · You have unusual behavior changes.  · You lose consciousness.  MAKE SURE YOU:  · Understand these instructions.  · Will watch your  condition.  · Will get help right away if you are not doing well or get worse.     This information is not intended to replace advice given to you by your health care provider. Make sure you discuss any questions you have with your health care provider.     Document Released: 09/07/2003 Document Revised: 07/08/2014 Document Reviewed: 01/07/2013  Elsevier Interactive Patient Education ©2016 [employer]

## 2016-01-10 NOTE — ED Notes (Signed)
Pt states he tripped on rug going into bathroom fell and hit his head on tile floor ? Syncope x 3 mins. Lac to lower lip.

## 2016-01-10 NOTE — ED Provider Notes (Signed)
CSN: [id number]     Arrival date & time 01/10/16  1322 History   First MD Initiated Contact with Patient 01/10/16 1331     Chief Complaint  Patient presents with  . Loss of Consciousness   HPI Patient presented to the emergency room for evaluation of a head and lip injury. Walking into his bathroom and flip-flops. He tripped on a rug and landed on the floor striking his lower lip and head. Loss of consciousness for approximately 3 minutes. He woke up on the floor with some blood on the ground. He went to an [facility] and was sent to the emergency room for further evaluation. Patient denies any neck pain. No numbness or weakness. No chest pain. No malocclusion.  Tet UTD History reviewed. No pertinent past medical history. Past Surgical History  Procedure Laterality Date  . Tonsillectomy    . Hernia repair     History reviewed. No pertinent family history. Social History  Substance Use Topics  . Smoking status: Never Smoker   . Smokeless tobacco: None  . Alcohol Use: No    Review of Systems  All other systems reviewed and are negative.     Allergies  Review of patient's allergies indicates no known allergies.  Home Medications   Prior to Admission medications   Not on File   BP 128/76 mmHg  Pulse 78  Temp(Src) 98.1 F (36.7 C) (Oral)  Resp 18  Ht 5\' 9"  (1.753 m)  Wt 85.73 kg  BMI 27.90 kg/m2  SpO2 95% Physical Exam  Constitutional: He appears well-developed and well-nourished. No distress.  HENT:  Head: Normocephalic.  Right Ear: External ear normal.  Left Ear: External ear normal.  Stellate Laceration of the lower lip that involves the mucosal and squamous surface  Eyes: Conjunctivae are normal. Right eye exhibits no discharge. Left eye exhibits no discharge. No scleral icterus.  Neck: Neck supple. No tracheal deviation present.  Cardiovascular: Normal rate, regular rhythm and intact distal pulses.   Pulmonary/Chest: Effort normal and breath sounds normal.  No stridor. No respiratory distress. He has no wheezes. He has no rales.  Abdominal: Soft. Bowel sounds are normal. He exhibits no distension. There is no tenderness. There is no rebound and no guarding.  Musculoskeletal: He exhibits no edema or tenderness.       Cervical back: Normal.       Thoracic back: Normal.       Lumbar back: Normal.  Neurological: He is alert. He has normal strength. No cranial nerve deficit (no facial droop, extraocular movements intact, no slurred speech) or sensory deficit. He exhibits normal muscle tone. He displays no seizure activity. Coordination normal.  Skin: Skin is warm and dry. No rash noted.  Psychiatric: He has a normal mood and affect.  Nursing note and vitals reviewed.   ED Course  .Marland Kitchen.Laceration Repair Date/Time: 01/10/2016 3:07 PM Performed by: Linwood DibblesKNAPP, Ramie Erman Authorized by: Linwood DibblesKNAPP, Nastashia Gallo Consent: Verbal consent obtained. Risks and benefits: risks, benefits and alternatives were discussed Consent given by: patient Patient understanding: patient states understanding of the procedure being performed Patient identity confirmed: verbally with patient Body area: head/neck Location details: lower lip [place] border involved: no Laceration length: 1 cm Tendon involvement: none Nerve involvement: none Vascular damage: no Anesthesia: local infiltration Local anesthetic: lidocaine 1% without epinephrine Anesthetic total: 2 ml Patient sedated: no Preparation: Patient was prepped and draped in the usual sterile fashion. Irrigation solution: saline Irrigation method: syringe Amount of cleaning: standard Debridement: none Degree of  undermining: none Mucous membrane closure: 6-0 fast-absorbing plain gut Number of sutures: 3 Technique: complex and simple Approximation: close Approximation difficulty: complex (stellate laceration, reapproximated the margins) Patient tolerance: Patient tolerated the procedure well with no immediate complications    Imaging Review Ct Head Wo Contrast  01/10/2016  CLINICAL DATA:  Pain following fall with loss of consciousness EXAM: CT HEAD WITHOUT CONTRAST TECHNIQUE: Contiguous axial images were obtained from the base of the skull through the vertex without intravenous contrast. COMPARISON:  None. FINDINGS: Brain: There is mild diffuse atrophy. There is no intracranial mass, hemorrhage, extra-axial fluid collection, or midline shift. There is slight small vessel disease in the centra semiovale bilaterally. Elsewhere gray-white compartments appear normal. No acute infarct evident. Vascular: There is calcification in the cavernous carotid arteries bilaterally. No hyperdense vessels are evident. Skull: The bony calvarium appears intact. Sinuses/Orbits: Orbits appear symmetric bilaterally. Visualized paranasal sinuses are clear. Other: There is a small air-fluid level in a posterior inferior right mastoid air cell. The mastoid air cells are clear. IMPRESSION: Mild diffuse atrophy with slight supratentorial small vessel disease. No intracranial mass, hemorrhage, or extra-axial fluid collection. No acute infarct. There are calcifications in the intracavernous carotid arteries bilaterally. No fracture. There is a tiny air-fluid level in inferior mastoid air cell on the right. Question traumatic versus inflammatory etiology. Electronically Signed   By: Bretta Bang III M.D.   On: 01/10/2016 14:07   I have personally reviewed and evaluated these images and lab results as part of my medical decision-making.    MDM   Final diagnoses:  Concussion, with loss of consciousness of 30 minutes or less, initial encounter  Lip laceration, initial encounter    Tolerated laceration repair well.  No serious injury associated with his fall. Absorbabe sutures used on the lip laceration Stable for discharge.      Linwood Dibbles, MD 01/10/16 [phone]

## 2016-08-12 DIAGNOSIS — R55 Syncope and collapse: Secondary | ICD-10-CM | POA: Diagnosis not present

## 2016-08-14 ENCOUNTER — Ambulatory Visit (INDEPENDENT_AMBULATORY_CARE_PROVIDER_SITE_OTHER): Payer: Medicare HMO

## 2016-08-14 ENCOUNTER — Other Ambulatory Visit: Payer: Self-pay | Admitting: Internal Medicine

## 2016-08-14 DIAGNOSIS — R55 Syncope and collapse: Secondary | ICD-10-CM

## 2016-08-26 DIAGNOSIS — R55 Syncope and collapse: Secondary | ICD-10-CM | POA: Diagnosis not present

## 2016-08-29 ENCOUNTER — Encounter: Payer: Self-pay | Admitting: Nurse Practitioner

## 2016-08-29 ENCOUNTER — Ambulatory Visit (INDEPENDENT_AMBULATORY_CARE_PROVIDER_SITE_OTHER): Payer: Medicare HMO | Admitting: Nurse Practitioner

## 2016-08-29 VITALS — BP 112/77 | HR 71 | Ht 69.0 in | Wt 197.8 lb

## 2016-08-29 DIAGNOSIS — R079 Chest pain, unspecified: Secondary | ICD-10-CM | POA: Diagnosis not present

## 2016-08-29 DIAGNOSIS — R55 Syncope and collapse: Secondary | ICD-10-CM

## 2016-08-29 DIAGNOSIS — R001 Bradycardia, unspecified: Secondary | ICD-10-CM | POA: Diagnosis not present

## 2016-08-29 NOTE — Progress Notes (Addendum)
Cardiology Clinic Note   Patient Name: Kyle Henderson Date of Encounter: 08/29/2016  Primary Care Provider:  Katy Apo, MD Primary Cardiologist:  B. Jens Som, MD (last seen 2010)  Patient Profile    76 year old male without significant prior cardiac history who presents for evaluation secondary to syncope.  Past Medical History    Past Medical History:  Diagnosis Date  . Cataracts, bilateral    a. s/p bilateral surgical correction.  . Chest pain    a. 05/2009 Stress Echo: nl EF, no ischemia.  . CKD (chronic kidney disease), stage III    a. 08/2016 GFR 57  . Hyperlipidemia   . Right inguinal hernia    a. s/p repair ~ 2000.  Marland Kitchen Sinus bradycardia    a. 08/2016 48h Holter: sinus brady/sinus arrhythmia, avg HR 63, min HR 37, max HR 105, longest pause 1.7 sec.  Marland Kitchen Syncope    a. 08/2015 while urinating;  b. 08/2016 x 2.   Past Surgical History:  Procedure Laterality Date  . CATARACT EXTRACTION, BILATERAL    . HERNIA REPAIR    . ROTATOR CUFF REPAIR Right   . TONSILLECTOMY      Allergies  No Known Allergies  History of Present Illness    76 year old male with a prior history of hyperlipidemia and stage III chronic kidney disease. He was previously evaluated by Dr. Jens Som in 2010 in the setting of chest pain. Stress echocardiography was performed at that time and showed no evidence of wall motion abnormalities with normal LV function. It was felt that his chest pain was most likely costochondritis. He has done reasonably well over the past 8 years. He is on no medications at home but will sometimes take a multivitamin. He has been followed closely by Dr. Nehemiah Settle.  In March 2017, shortly after awakening, he was standing and voiding into the toilet, when he had sudden onset of syncope without warning. He did not have any evaluation related to this. He had no recurrence of syncope for the remainder of 2017. Approximately 2-3 weeks ago, he was at [facility] with a friend and he was  sitting and eating a hot dog. He had a sudden "odd" sensation and the next thing he knew, his friend was shaking his arm trying to get his attention. His friend relayed that he briefly lost consciousness for about 5 seconds. The patient was sitting the entire time. His head did not fall forward. Upon regaining consciousness, he was not having any chest pain, dyspnea, palpitations, lightheadedness, nausea, vomiting, or diaphoresis. He did not think very much of the spell and he and the friend left, scale and went to her house. There, they had some coffee and when he was ready to leave, he was walking down her outside steps and had sudden onset of lightheadedness. Because of this, he went back up the steps to go back into her home. He says he walked across the kitchen and then his next recollection is of his friend standing over him and shaking him. He had apparently lost consciousness while walking across the kitchen, and fell onto his back. He does not remember falling. Again, after regaining consciousness, he did not have any prodromal symptoms. Because of these 2 episodes, he was evaluated by his primary care provider with lab work performed and relatively unrevealing. A 48 hour Holter monitor was subsequently placed and this showed sinus bradycardia and sinus arrhythmia throughout much of the day. During the hours of 10 PM to approximately 8  AM, he frequently had bradycardic rates into the 40s and sometimes 30s. He also had periodic bradycardia with rates in the 40s in the late morning and early afternoon hours as well. Longest pause was 1.7 seconds. Average heart rate was 63. Lowest heart rate was 37 while the maximum heart rate was 105. No evidence of high-grade heart block was noted. Patient had no symptoms in the 48 hour period that he wore the monitor. In discussing his monitor and results today, he notes that he does nap frequently in the early afternoon, and also thinks that he snores. He has never been  evaluated for sleep apnea. He has had no recurrence of presyncope or syncope since prior to wearing the monitor but has noted intermittent retrosternal chest discomfort that generally occurs with rest without associated symptoms lasting a few minutes, and resolving spontaneously. Given that he was placed told he has costochondritis, he thought that maybe that's what these symptoms represented. There are no associated symptoms with this chest discomfort. He has not noticed any change in exercise tolerance. He denies any prior or recent history of PND, orthopnea, edema, or early satiety.  Home Medications    Prior to Admission medications   MVI 1 Daily    Family History    Family History  Problem Relation Age of Onset  . Alzheimer's disease Mother 42  . Heart attack Father 41  . Stroke Brother 60    Died following ? carotid surgery.    Social History    Social History   Social History  . Marital status: Single    Spouse name: N/A  . Number of children: N/A  . Years of education: N/A   Occupational History  . Retired     Public librarian   Social History Main Topics  . Smoking status: Never Smoker  . Smokeless tobacco: Never Used  . Alcohol use No     Comment: occasional drink.  . Drug use: No  . Sexual activity: No   Other Topics Concern  . Not on file   Social History Narrative   Lives in [place] by himself.  Does not routinely exercise.     Review of Systems    General:  No chills, fever, night sweats or weight changes.  Cardiovascular:  No chest pain, dyspnea on exertion, edema, orthopnea, palpitations, paroxysmal nocturnal dyspnea. +++ LH and syncope x 2. Dermatological: No rash, lesions/masses Respiratory: No cough, dyspnea Urologic: No hematuria, dysuria Abdominal:   No nausea, vomiting, diarrhea, bright red blood per rectum, melena, or hematemesis Neurologic:  No visual changes, wkns, changes in mental status. All other systems reviewed and are otherwise  negative except as noted above.  Physical Exam    VS:  BP 112/77   Pulse 71   Ht 5\' 9"  (1.753 m)   Wt 197 lb 12.8 oz (89.7 kg)   SpO2 96%   BMI 29.21 kg/m  , BMI Body mass index is 29.21 kg/m.  Blood pressure lying 131/84, heart rate 55; blood pressure sitting 132/78, heart rate 62; blood pressure standing 130/81, heart rate 74 GEN: Well nourished, well developed, in no acute distress.  HEENT: normal.  Neck: Supple, no JVD, carotid bruits, or masses. Cardiac: RRR, no murmurs, rubs, or gallops. No clubbing, cyanosis, edema.  Radials/DP/PT 2+ and equal bilaterally.  Respiratory:  Respirations regular and unlabored, clear to auscultation bilaterally. GI: Soft, nontender, nondistended, BS + x 4. MS: no deformity or atrophy. Skin: warm and dry, no rash. Neuro:  Strength and sensation are intact. Psych: Normal affect.  Accessory Clinical Findings    ECG - Sinus bradycardia/sinus arrhythmia, 58, nonspecific T changes. No acute ST or T changes. No evidence of high-grade heart block.  Assessment & Plan   1.  Syncope: Patient presents to reestablish care after a single episode of syncope in the act of micturition in March 2017 and more recently 2 episodes of syncope on the same day. The first episode occurred while sitting and eating a hot dog. He was not having any choking or coughing prior to that episode. It was preceded by a sensation that something wasn't right but not necessarily presyncope. The second episode occurred while walking in a neighbor's home. He had onset of lightheadedness followed by sudden syncope. He does not remember falling. A 48-hour Holter monitor showed sinus bradycardia with rate sometimes into the 30s and frequently into the 40s, mostly during periods of sleep. Longest pause was only 1.7 seconds. There was no evidence of high-grade heart block during monitoring.  Further, despite bradycardia during monitoring, he did not have any symptoms. I have discussed his case  with Dr. Jens Somrenshaw today and will arrange for 2-D echocardiogram to evaluate LV function, and also place a 30 day event monitor to see if we can capture rhythm during symptoms. If this is unrevealing, we will need to consider EP referral for implantable loop recorder (he is not currently interested in this). Patient has been advised not to drive. As much of his bradycardia occurred during periods of sleep specifically at night but also likely during napping hours during the day, I recommend outpatient sleep study. He is not currently interested.  2. Chest pain: Patient reports a long history of chest pain with negative stress test in 2010. Over the past 2-3 weeks, he has noted more frequent episodes of retrosternal chest discomfort. He says this is somewhat superficial in nature but is not reproducible with palpation, position changes, or deep breathing. Given recent syncope and more frequent chest pain, I will arrange for an exercise Myoview to rule out ischemia.  3. Snoring: Patient believes she snores and was noted to have fairly significant bradycardia during hours of sleep with rates into the 30s. He may very likely have sleep apnea. He is not currently interested in a sleep study.  4. Disposition: Follow-up echo, event monitoring, and Myoview. Follow-up in clinic in 4-6 weeks with Dr. Jens Somrenshaw or sooner if necessary.   Nicolasa Duckinghristopher Taim Wurm, NP 08/29/2016, 9:39 AM

## 2016-08-29 NOTE — Patient Instructions (Addendum)
Your physician has requested that you have an echocardiogram. Echocardiography is a painless test that uses sound waves to create images of your heart. It provides your doctor with information about the size and shape of your heart and how well your heart's chambers and valves are working. This procedure takes approximately one hour. There are no restrictions for this procedure. [facility] unless other wise requested.  Your physician has recommended that you wear an 30 day event monitor to help find out the reason for your syncopal episodes. Event monitors are medical devices that record the heart's electrical activity. Doctors most often us these monitors to diagnose arrhythmias. Arrhythmias are problems with the speed or rhythm of the heartbeat. The monitor is a small, portable device. You can wear one while you do your normal daily activities. This is usually used to diagnose what is causing palpitations/syncope (passing out).  Your physician has requested that you have a exercise stress myoview. For further information please visit [email or web address]. Please follow instruction sheet, as given.  Your physician recommends that you schedule a follow-up appointment in: 4-6 weeks with Dr Jens Somrenshaw.

## 2016-09-03 ENCOUNTER — Telehealth (HOSPITAL_COMMUNITY): Payer: Self-pay

## 2016-09-03 NOTE — Telephone Encounter (Signed)
Encounter complete. 

## 2016-09-05 ENCOUNTER — Ambulatory Visit (HOSPITAL_COMMUNITY)
Admission: RE | Admit: 2016-09-05 | Discharge: 2016-09-05 | Disposition: A | Discharge status: Home / Self Care | Payer: Medicare HMO | Source: Ambulatory Visit | Attending: Internal Medicine | Admitting: Internal Medicine

## 2016-09-05 DIAGNOSIS — K219 Gastro-esophageal reflux disease without esophagitis: Secondary | ICD-10-CM | POA: Diagnosis not present

## 2016-09-05 DIAGNOSIS — R001 Bradycardia, unspecified: Secondary | ICD-10-CM | POA: Insufficient documentation

## 2016-09-05 DIAGNOSIS — E785 Hyperlipidemia, unspecified: Secondary | ICD-10-CM | POA: Diagnosis not present

## 2016-09-05 DIAGNOSIS — Z8249 Family history of ischemic heart disease and other diseases of the circulatory system: Secondary | ICD-10-CM | POA: Insufficient documentation

## 2016-09-05 DIAGNOSIS — R079 Chest pain, unspecified: Secondary | ICD-10-CM | POA: Diagnosis not present

## 2016-09-05 DIAGNOSIS — N183 Chronic kidney disease, stage 3 (moderate): Secondary | ICD-10-CM | POA: Insufficient documentation

## 2016-09-05 LAB — MYOCARDIAL PERFUSION IMAGING
CHL CUP NUCLEAR SSS: 3
CHL CUP RESTING HR STRESS: 65 {beats}/min
LVDIAVOL: 69 mL (ref 62–150)
LVSYSVOL: 25 mL
NUC STRESS TID: 0.84
Peak HR: 91 {beats}/min
SDS: 0
SRS: 3

## 2016-09-05 MED ORDER — TECHNETIUM TC 99M TETROFOSMIN IV KIT
10.4000 | PACK | Freq: Once | INTRAVENOUS | Status: AC | PRN
  Administered 2016-09-05: 10.4 via INTRAVENOUS
  Filled 2016-09-05: qty 11

## 2016-09-05 MED ORDER — TECHNETIUM TC 99M TETROFOSMIN IV KIT
31.4000 | PACK | Freq: Once | INTRAVENOUS | Status: AC | PRN
  Administered 2016-09-05: 31.4 via INTRAVENOUS
  Filled 2016-09-05: qty 32

## 2016-09-05 MED ORDER — REGADENOSON 0.4 MG/5ML IV SOLN
0.4000 mg | Freq: Once | INTRAVENOUS | Status: AC
  Administered 2016-09-05: 0.4 mg via INTRAVENOUS

## 2016-09-16 ENCOUNTER — Other Ambulatory Visit: Payer: Self-pay

## 2016-09-16 ENCOUNTER — Ambulatory Visit (INDEPENDENT_AMBULATORY_CARE_PROVIDER_SITE_OTHER): Payer: Medicare HMO

## 2016-09-16 ENCOUNTER — Ambulatory Visit (HOSPITAL_COMMUNITY): Payer: Medicare HMO | Attending: Cardiovascular Disease

## 2016-09-16 DIAGNOSIS — R55 Syncope and collapse: Secondary | ICD-10-CM

## 2016-09-16 DIAGNOSIS — R001 Bradycardia, unspecified: Secondary | ICD-10-CM | POA: Insufficient documentation

## 2016-09-16 DIAGNOSIS — N189 Chronic kidney disease, unspecified: Secondary | ICD-10-CM | POA: Insufficient documentation

## 2016-09-16 DIAGNOSIS — E785 Hyperlipidemia, unspecified: Secondary | ICD-10-CM | POA: Insufficient documentation

## 2016-10-02 ENCOUNTER — Telehealth (HOSPITAL_COMMUNITY): Payer: Self-pay | Admitting: Cardiology

## 2016-10-02 NOTE — Telephone Encounter (Signed)
New Message     1. Is this related to a heart monitor you are wearing?  (If the patient says no, please ask     if they are caling about ICD/pacemaker.) Yes  2. What is your issue?? Per pt skin has broken out, and has open sores. Requesting call back from nurse. (If the patient is calling for results of the heart monitor this     message should be sent to nurse.)

## 2016-10-02 NOTE — Telephone Encounter (Signed)
Spoke to patient, identified concern for generalized rash on chest which started as skin irritation on electrode site from wearing [employer] monitor. Advised patient to use benadryl or hydrocortisone cream on affected area, if unresolved after 2-3 days may need to call back for further instructions. Advised to contact company for recommendations on hypoallergenic pads. Pt states he may call but will probably want to return monitor - he'll discuss w monitor rep to see if extension can be given so he can take a few days off wearing.  Aware to call back if new concerns, and that we will updated him with outcome of monitor report once ready and reviewed.

## 2016-10-24 NOTE — Progress Notes (Signed)
      HPI: Follow-up syncope. Stress echocardiogram in 2010 normal. Patient seen March 2018 for syncope. 48 hour Holter monitor showed sinus bradycardia and sinus arrhythmia. In the late evening and early morning hours he had bradycardia with rates in the 40s and 30s. Nuclear study March 2018 showed ejection fraction 63% and normal perfusion. Echocardiogram March 2018 showed normal LV systolic function, grade 1 diastolic dysfunction, trace aortic insufficiency and moderate biatrial enlargement. Event monitor March 2018 showed sinus rhythm with PACs. At previous office visit patient was not interested in implantable loop monitor or evaluation for sleep apnea. Since last seen, he denies any recurrent syncope. He denies dyspnea on exertion, orthopnea, PND, pedal edema, exertional chest pain. No palpitations.  No current outpatient prescriptions on file.   No current facility-administered medications for this visit.      Past Medical History:  Diagnosis Date  . Cataracts, bilateral    a. s/p bilateral surgical correction.  . Chest pain    a. 05/2009 Stress Echo: nl EF, no ischemia.  . CKD (chronic kidney disease), stage III    a. 08/2016 GFR 57  . Hyperlipidemia   . Right inguinal hernia    a. s/p repair ~ 2000.  Marland Kitchen Sinus bradycardia    a. 08/2016 48h Holter: sinus brady/sinus arrhythmia, avg HR 63, min HR 37, max HR 105, longest pause 1.7 sec.  Marland Kitchen Syncope    a. 08/2015 while urinating;  b. 08/2016 x 2.    Past Surgical History:  Procedure Laterality Date  . CATARACT EXTRACTION, BILATERAL    . HERNIA REPAIR    . ROTATOR CUFF REPAIR Right   . TONSILLECTOMY      Social History   Social History  . Marital status: Single    Spouse name: N/A  . Number of children: N/A  . Years of education: N/A   Occupational History  . Retired     Public librarian   Social History Main Topics  . Smoking status: Never Smoker  . Smokeless tobacco: Never Used  . Alcohol use No     Comment:  occasional drink.  . Drug use: No  . Sexual activity: No   Other Topics Concern  . Not on file   Social History Narrative   Lives in [place] by himself.  Does not routinely exercise.    Family History  Problem Relation Age of Onset  . Alzheimer's disease Mother 2  . Heart attack Father 60  . Stroke Brother 76    Died following ? carotid surgery.    ROS: no fevers or chills, productive cough, hemoptysis, dysphasia, odynophagia, melena, hematochezia, dysuria, hematuria, rash, seizure activity, orthopnea, PND, pedal edema, claudication. Remaining systems are negative.  Physical Exam: Well-developed well-nourished in no acute distress.  Skin is warm and dry.  HEENT is normal.  Neck is supple. No bruits. Chest is clear to auscultation with normal expansion.  Cardiovascular exam is regular rate and rhythm. No murmurs. Abdominal exam nontender or distended. No masses palpated. Extremities show no edema. neuro grossly intact   A/P  1 Syncope-etiology is unclear. LV function is normal. We can consider implantable loop in the future for recurrent episodes if he is agreeable. Patient should not drive for 6 months following recent episode.  2 Chest pain-patient does not have exertional chest pain. Nuclear study negative. No plans for further evaluation.  Olga Millers, MD

## 2016-10-31 ENCOUNTER — Encounter: Payer: Self-pay | Admitting: Cardiology

## 2016-10-31 ENCOUNTER — Ambulatory Visit (INDEPENDENT_AMBULATORY_CARE_PROVIDER_SITE_OTHER): Payer: Medicare HMO | Admitting: Cardiology

## 2016-10-31 VITALS — BP 130/66 | HR 54 | Ht 69.0 in | Wt 200.0 lb

## 2016-10-31 DIAGNOSIS — R55 Syncope and collapse: Secondary | ICD-10-CM

## 2016-10-31 DIAGNOSIS — R079 Chest pain, unspecified: Secondary | ICD-10-CM | POA: Diagnosis not present

## 2016-10-31 NOTE — Patient Instructions (Signed)
Your physician wants you to follow-up in: 6 MONTHS WITH DR CRENSHAW You will receive a reminder letter in the mail two months in advance. If you don't receive a letter, please call our office to schedule the follow-up appointment.   If you need a refill on your cardiac medications before your next appointment, please call your pharmacy.  

## 2017-06-03 DIAGNOSIS — Z1389 Encounter for screening for other disorder: Secondary | ICD-10-CM | POA: Diagnosis not present

## 2017-06-03 DIAGNOSIS — Z87898 Personal history of other specified conditions: Secondary | ICD-10-CM | POA: Diagnosis not present

## 2017-06-03 DIAGNOSIS — E7800 Pure hypercholesterolemia, unspecified: Secondary | ICD-10-CM | POA: Diagnosis not present

## 2017-06-03 DIAGNOSIS — Z23 Encounter for immunization: Secondary | ICD-10-CM | POA: Diagnosis not present

## 2017-06-03 DIAGNOSIS — Z0000 Encounter for general adult medical examination without abnormal findings: Secondary | ICD-10-CM | POA: Diagnosis not present

## 2017-06-03 DIAGNOSIS — N183 Chronic kidney disease, stage 3 (moderate): Secondary | ICD-10-CM | POA: Diagnosis not present

## 2018-02-09 DIAGNOSIS — R0981 Nasal congestion: Secondary | ICD-10-CM | POA: Diagnosis not present

## 2018-07-03 DIAGNOSIS — R42 Dizziness and giddiness: Secondary | ICD-10-CM | POA: Diagnosis not present

## 2018-07-07 ENCOUNTER — Other Ambulatory Visit: Payer: Self-pay | Admitting: Otolaryngology

## 2018-07-07 DIAGNOSIS — J320 Chronic maxillary sinusitis: Secondary | ICD-10-CM | POA: Diagnosis not present

## 2018-07-07 DIAGNOSIS — J040 Acute laryngitis: Secondary | ICD-10-CM | POA: Diagnosis not present

## 2018-07-07 DIAGNOSIS — H6522 Chronic serous otitis media, left ear: Secondary | ICD-10-CM | POA: Diagnosis not present

## 2018-07-07 DIAGNOSIS — H9313 Tinnitus, bilateral: Secondary | ICD-10-CM | POA: Diagnosis not present

## 2018-07-07 DIAGNOSIS — H6692 Otitis media, unspecified, left ear: Secondary | ICD-10-CM | POA: Diagnosis not present

## 2018-07-07 DIAGNOSIS — J322 Chronic ethmoidal sinusitis: Secondary | ICD-10-CM | POA: Diagnosis not present

## 2018-07-07 DIAGNOSIS — H8113 Benign paroxysmal vertigo, bilateral: Secondary | ICD-10-CM | POA: Diagnosis not present

## 2018-07-08 ENCOUNTER — Other Ambulatory Visit: Payer: Self-pay | Admitting: Otolaryngology

## 2018-07-08 DIAGNOSIS — R42 Dizziness and giddiness: Secondary | ICD-10-CM

## 2018-07-10 DIAGNOSIS — Z1389 Encounter for screening for other disorder: Secondary | ICD-10-CM | POA: Diagnosis not present

## 2018-07-10 DIAGNOSIS — Z0000 Encounter for general adult medical examination without abnormal findings: Secondary | ICD-10-CM | POA: Diagnosis not present

## 2018-07-10 DIAGNOSIS — N183 Chronic kidney disease, stage 3 (moderate): Secondary | ICD-10-CM | POA: Diagnosis not present

## 2018-07-10 DIAGNOSIS — R42 Dizziness and giddiness: Secondary | ICD-10-CM | POA: Diagnosis not present

## 2018-07-11 ENCOUNTER — Ambulatory Visit
Admission: RE | Admit: 2018-07-11 | Discharge: 2018-07-11 | Disposition: A | Discharge status: Home / Self Care | Payer: Medicare HMO | Source: Ambulatory Visit | Attending: Otolaryngology | Admitting: Otolaryngology

## 2018-07-11 DIAGNOSIS — R42 Dizziness and giddiness: Secondary | ICD-10-CM

## 2018-07-16 DIAGNOSIS — H8111 Benign paroxysmal vertigo, right ear: Secondary | ICD-10-CM | POA: Diagnosis not present

## 2018-07-16 DIAGNOSIS — R42 Dizziness and giddiness: Secondary | ICD-10-CM | POA: Diagnosis not present

## 2018-07-16 DIAGNOSIS — H9190 Unspecified hearing loss, unspecified ear: Secondary | ICD-10-CM | POA: Diagnosis not present

## 2018-12-09 DIAGNOSIS — M25551 Pain in right hip: Secondary | ICD-10-CM | POA: Diagnosis not present

## 2018-12-09 DIAGNOSIS — M6281 Muscle weakness (generalized): Secondary | ICD-10-CM | POA: Diagnosis not present

## 2018-12-09 DIAGNOSIS — R262 Difficulty in walking, not elsewhere classified: Secondary | ICD-10-CM | POA: Diagnosis not present

## 2018-12-11 DIAGNOSIS — R262 Difficulty in walking, not elsewhere classified: Secondary | ICD-10-CM | POA: Diagnosis not present

## 2018-12-11 DIAGNOSIS — M25551 Pain in right hip: Secondary | ICD-10-CM | POA: Diagnosis not present

## 2018-12-11 DIAGNOSIS — M6281 Muscle weakness (generalized): Secondary | ICD-10-CM | POA: Diagnosis not present

## 2018-12-15 DIAGNOSIS — M25551 Pain in right hip: Secondary | ICD-10-CM | POA: Diagnosis not present

## 2018-12-15 DIAGNOSIS — R262 Difficulty in walking, not elsewhere classified: Secondary | ICD-10-CM | POA: Diagnosis not present

## 2018-12-15 DIAGNOSIS — M6281 Muscle weakness (generalized): Secondary | ICD-10-CM | POA: Diagnosis not present

## 2018-12-17 DIAGNOSIS — M25551 Pain in right hip: Secondary | ICD-10-CM | POA: Diagnosis not present

## 2018-12-17 DIAGNOSIS — R262 Difficulty in walking, not elsewhere classified: Secondary | ICD-10-CM | POA: Diagnosis not present

## 2018-12-17 DIAGNOSIS — M6281 Muscle weakness (generalized): Secondary | ICD-10-CM | POA: Diagnosis not present

## 2018-12-23 DIAGNOSIS — R262 Difficulty in walking, not elsewhere classified: Secondary | ICD-10-CM | POA: Diagnosis not present

## 2018-12-23 DIAGNOSIS — M25551 Pain in right hip: Secondary | ICD-10-CM | POA: Diagnosis not present

## 2018-12-23 DIAGNOSIS — M6281 Muscle weakness (generalized): Secondary | ICD-10-CM | POA: Diagnosis not present

## 2018-12-25 DIAGNOSIS — R262 Difficulty in walking, not elsewhere classified: Secondary | ICD-10-CM | POA: Diagnosis not present

## 2018-12-25 DIAGNOSIS — M6281 Muscle weakness (generalized): Secondary | ICD-10-CM | POA: Diagnosis not present

## 2018-12-25 DIAGNOSIS — M25551 Pain in right hip: Secondary | ICD-10-CM | POA: Diagnosis not present

## 2018-12-30 DIAGNOSIS — R262 Difficulty in walking, not elsewhere classified: Secondary | ICD-10-CM | POA: Diagnosis not present

## 2018-12-30 DIAGNOSIS — M6281 Muscle weakness (generalized): Secondary | ICD-10-CM | POA: Diagnosis not present

## 2018-12-30 DIAGNOSIS — M25551 Pain in right hip: Secondary | ICD-10-CM | POA: Diagnosis not present

## 2019-01-06 DIAGNOSIS — R262 Difficulty in walking, not elsewhere classified: Secondary | ICD-10-CM | POA: Diagnosis not present

## 2019-01-06 DIAGNOSIS — M6281 Muscle weakness (generalized): Secondary | ICD-10-CM | POA: Diagnosis not present

## 2019-01-06 DIAGNOSIS — M25551 Pain in right hip: Secondary | ICD-10-CM | POA: Diagnosis not present

## 2019-01-13 DIAGNOSIS — R262 Difficulty in walking, not elsewhere classified: Secondary | ICD-10-CM | POA: Diagnosis not present

## 2019-01-13 DIAGNOSIS — M6281 Muscle weakness (generalized): Secondary | ICD-10-CM | POA: Diagnosis not present

## 2019-01-13 DIAGNOSIS — M25551 Pain in right hip: Secondary | ICD-10-CM | POA: Diagnosis not present

## 2019-01-20 DIAGNOSIS — R262 Difficulty in walking, not elsewhere classified: Secondary | ICD-10-CM | POA: Diagnosis not present

## 2019-01-20 DIAGNOSIS — M25551 Pain in right hip: Secondary | ICD-10-CM | POA: Diagnosis not present

## 2019-01-20 DIAGNOSIS — M6281 Muscle weakness (generalized): Secondary | ICD-10-CM | POA: Diagnosis not present

## 2019-05-03 ENCOUNTER — Other Ambulatory Visit: Payer: Self-pay

## 2019-05-03 DIAGNOSIS — Z20822 Contact with and (suspected) exposure to covid-19: Secondary | ICD-10-CM

## 2019-05-03 DIAGNOSIS — Z20828 Contact with and (suspected) exposure to other viral communicable diseases: Secondary | ICD-10-CM | POA: Diagnosis not present

## 2019-05-04 LAB — NOVEL CORONAVIRUS, NAA: SARS-CoV-2, NAA: NOT DETECTED

## 2019-08-06 DIAGNOSIS — Z1389 Encounter for screening for other disorder: Secondary | ICD-10-CM | POA: Diagnosis not present

## 2019-08-06 DIAGNOSIS — N1831 Chronic kidney disease, stage 3a: Secondary | ICD-10-CM | POA: Diagnosis not present

## 2019-08-06 DIAGNOSIS — Z0000 Encounter for general adult medical examination without abnormal findings: Secondary | ICD-10-CM | POA: Diagnosis not present

## 2019-08-06 DIAGNOSIS — M25512 Pain in left shoulder: Secondary | ICD-10-CM | POA: Diagnosis not present

## 2019-08-09 DIAGNOSIS — M19012 Primary osteoarthritis, left shoulder: Secondary | ICD-10-CM | POA: Diagnosis not present

## 2019-08-26 ENCOUNTER — Ambulatory Visit: Payer: Medicare HMO | Attending: Internal Medicine

## 2019-08-26 DIAGNOSIS — Z23 Encounter for immunization: Secondary | ICD-10-CM | POA: Insufficient documentation

## 2019-08-26 NOTE — Progress Notes (Signed)
   Covid-19 Vaccination Clinic  Name:  MONTERIUS ROLF    MRN: [id number] DOB: August 19, 1940  08/26/2019  Mr. Molder was observed post Covid-19 immunization for 15 minutes without incidence. He was provided with Vaccine Information Sheet and instruction to access the V-Safe system.   Mr. Kent was instructed to call 911 with any severe reactions post vaccine: Marland Kitchen Difficulty breathing  . Swelling of your face and throat  . A fast heartbeat  . A bad rash all over your body  . Dizziness and weakness    Immunizations Administered    Name Date Dose VIS Date Route   Pfizer COVID-19 Vaccine 08/26/2019 12:39 PM 0.3 mL 06/11/2019 Intramuscular   Manufacturer: [employer], [employer]   Lot: [id number]   NDC: [address]-0

## 2019-09-13 DIAGNOSIS — M25512 Pain in left shoulder: Secondary | ICD-10-CM | POA: Diagnosis not present

## 2019-09-21 ENCOUNTER — Ambulatory Visit: Payer: Medicare HMO | Attending: Internal Medicine

## 2019-09-21 DIAGNOSIS — Z23 Encounter for immunization: Secondary | ICD-10-CM

## 2019-09-21 NOTE — Progress Notes (Signed)
   Covid-19 Vaccination Clinic  Name:  EMANUAL LAMOUNTAIN    MRN: [id number] DOB: [birth year]  09/21/2019  Mr. Harshfield was observed post Covid-19 immunization for 15 minutes without incident. He was provided with Vaccine Information Sheet and instruction to access the V-Safe system.   Mr. Interrante was instructed to call 911 with any severe reactions post vaccine: Marland Kitchen Difficulty breathing  . Swelling of face and throat  . A fast heartbeat  . A bad rash all over body  . Dizziness and weakness   Immunizations Administered    Name Date Dose VIS Date Route   Pfizer COVID-19 Vaccine 09/21/2019 12:14 PM 0.3 mL 06/11/2019 Intramuscular   Manufacturer: [employer], [employer]   Lot: [id number]   NDC: [address]-5

## 2020-05-29 DIAGNOSIS — R55 Syncope and collapse: Secondary | ICD-10-CM | POA: Diagnosis not present

## 2020-06-27 DIAGNOSIS — R55 Syncope and collapse: Secondary | ICD-10-CM | POA: Diagnosis not present

## 2020-06-29 NOTE — Progress Notes (Signed)
Referring-Ronald Polite MD Reason for referral-Syncope  HPI: 79 year old male for evaluation of syncope at request of Renford Dills MD.  Patient seen previously but not since May 2018. Stress echocardiogram in 2010 normal. Patient seen March 2018 for syncope. 48 hour Holter monitor showed sinus bradycardia and sinus arrhythmia. In the late evening and early morning hours he had bradycardia with rates in the 40s and 30s. Nuclear study March 2018 showed ejection fraction 63% and normal perfusion. Echocardiogram March 2018 showed normal LV systolic function, grade 1 diastolic dysfunction, trace aortic insufficiency and moderate biatrial enlargement. Event monitor March 2018 showed sinus rhythm with PACs.  Patient has done well since previously seen with no dyspnea on exertion, orthopnea, PND, pedal edema, palpitations or chest pain.  Approximately 4 weeks ago he had difficulties with sinus congestion and vertigo.  He was at home in his kitchen and his symptoms worsened.  He was walking to sit down and had a frank syncopal episode.  Unconscious for 1 to 2 minutes.  No preceding chest pain, palpitations, dyspnea, nausea, diaphoresis.  No incontinence or seizure activity.  He has done well since then with no recurrent episodes.  Cardiology now asked to evaluate.  Note he denies orthostatic symptoms but did state he was not hydrating prior to his event.  Current Outpatient Medications  Medication Sig Dispense Refill  . Multiple Vitamin (MULTIVITAMIN WITH MINERALS) TABS tablet Take 1 tablet by mouth daily.     No current facility-administered medications for this visit.    No Known Allergies   Past Medical History:  Diagnosis Date  . Cataracts, bilateral    a. s/p bilateral surgical correction.  . Chest pain    a. 05/2009 Stress Echo: nl EF, no ischemia.  . CKD (chronic kidney disease), stage III (HCC)    a. 08/2016 GFR 57  . Hyperlipidemia   . Right inguinal hernia    a. s/p repair ~ 2000.  Marland Kitchen  Sinus bradycardia    a. 08/2016 48h Holter: sinus brady/sinus arrhythmia, avg HR 63, min HR 37, max HR 105, longest pause 1.7 sec.  Marland Kitchen Syncope    a. 08/2015 while urinating;  b. 08/2016 x 2.    Past Surgical History:  Procedure Laterality Date  . CATARACT EXTRACTION, BILATERAL    . HERNIA REPAIR    . ROTATOR CUFF REPAIR Right   . TONSILLECTOMY      Social History   Socioeconomic History  . Marital status: Single    Spouse name: Not on file  . Number of children: Not on file  . Years of education: Not on file  . Highest education level: Not on file  Occupational History  . Occupation: Retired    Comment: Public librarian  Tobacco Use  . Smoking status: Never Smoker  . Smokeless tobacco: Never Used  Substance and Sexual Activity  . Alcohol use: Yes    Comment: occasional drink.  . Drug use: No  . Sexual activity: Never  Other Topics Concern  . Not on file  Social History Narrative   Lives in [place] by himself.  Does not routinely exercise.   Social Determinants of Health   Financial Resource Strain: Not on file  Food Insecurity: Not on file  Transportation Needs: Not on file  Physical Activity: Not on file  Stress: Not on file  Social Connections: Not on file  Intimate Partner Violence: Not on file    Family History  Problem Relation Age of Onset  . Alzheimer's  disease Mother 47  . Heart attack Father 61  . Stroke Brother 64       Died following ? carotid surgery.    ROS: no fevers or chills, productive cough, hemoptysis, dysphasia, odynophagia, melena, hematochezia, dysuria, hematuria, rash, seizure activity, orthopnea, PND, pedal edema, claudication. Remaining systems are negative.  Physical Exam:   Blood pressure 140/68, pulse 76, height 5' 8.5" (1.74 m), weight 194 lb 6.4 oz (88.2 kg).  General:  Well developed/well nourished in NAD Skin warm/dry Patient not depressed No peripheral clubbing Back-normal HEENT-normal/normal eyelids Neck  supple/normal carotid upstroke bilaterally; no bruits; no JVD; no thyromegaly chest - CTA/ normal expansion CV - RRR/normal S1 and S2; no murmurs, rubs or gallops;  PMI nondisplaced Abdomen -NT/ND, no HSM, no mass, + bowel sounds, no bruit 2+ femoral pulses, no bruits Ext-no edema, chords, 2+ DP Neuro-grossly nonfocal  ECG -2020-05-29-sinus rhythm with PACs, low voltage.  Personally reviewed  A/P  1 syncope-etiology unclear.  Electrocardiogram unrevealing.  Symptoms not clearly orthostatic or vagal in etiology.  He did have a monitor that was recently removed and we will ask for the results to be forwarded to [place] to review.  Plan to repeat echocardiogram to reassess LV function.  Patient instructed not to drive for 6 months.  2  hyperlipidemia-follow-up primary care.  Olga Millers, MD

## 2020-07-06 ENCOUNTER — Encounter: Payer: Self-pay | Admitting: Cardiology

## 2020-07-06 ENCOUNTER — Ambulatory Visit: Payer: Medicare HMO | Admitting: Cardiology

## 2020-07-06 ENCOUNTER — Other Ambulatory Visit: Payer: Self-pay

## 2020-07-06 VITALS — BP 140/68 | HR 76 | Ht 68.5 in | Wt 194.4 lb

## 2020-07-06 DIAGNOSIS — R55 Syncope and collapse: Secondary | ICD-10-CM

## 2020-07-06 DIAGNOSIS — E7800 Pure hypercholesterolemia, unspecified: Secondary | ICD-10-CM | POA: Diagnosis not present

## 2020-07-06 NOTE — Patient Instructions (Signed)

## 2020-07-24 ENCOUNTER — Encounter: Payer: Self-pay | Admitting: *Deleted

## 2020-07-25 ENCOUNTER — Other Ambulatory Visit: Payer: Self-pay

## 2020-07-25 ENCOUNTER — Ambulatory Visit (HOSPITAL_COMMUNITY): Payer: Medicare HMO | Attending: Internal Medicine

## 2020-07-25 DIAGNOSIS — R55 Syncope and collapse: Secondary | ICD-10-CM | POA: Diagnosis not present

## 2020-07-25 LAB — ECHOCARDIOGRAM COMPLETE
Area-P 1/2: 3.62 cm2
P 1/2 time: 397 msec
S' Lateral: 2.6 cm

## 2020-08-03 ENCOUNTER — Encounter: Payer: Self-pay | Admitting: *Deleted

## 2020-08-31 DIAGNOSIS — R55 Syncope and collapse: Secondary | ICD-10-CM | POA: Diagnosis not present

## 2020-08-31 DIAGNOSIS — E7800 Pure hypercholesterolemia, unspecified: Secondary | ICD-10-CM | POA: Diagnosis not present

## 2020-08-31 DIAGNOSIS — Z0000 Encounter for general adult medical examination without abnormal findings: Secondary | ICD-10-CM | POA: Diagnosis not present

## 2020-08-31 DIAGNOSIS — N1831 Chronic kidney disease, stage 3a: Secondary | ICD-10-CM | POA: Diagnosis not present

## 2021-09-12 DIAGNOSIS — E7800 Pure hypercholesterolemia, unspecified: Secondary | ICD-10-CM | POA: Diagnosis not present

## 2021-09-12 DIAGNOSIS — Z0000 Encounter for general adult medical examination without abnormal findings: Secondary | ICD-10-CM | POA: Diagnosis not present

## 2021-09-12 DIAGNOSIS — N1831 Chronic kidney disease, stage 3a: Secondary | ICD-10-CM | POA: Diagnosis not present

## 2023-03-21 DIAGNOSIS — Z0000 Encounter for general adult medical examination without abnormal findings: Secondary | ICD-10-CM | POA: Diagnosis not present

## 2023-03-21 DIAGNOSIS — N1831 Chronic kidney disease, stage 3a: Secondary | ICD-10-CM | POA: Diagnosis not present

## 2023-03-21 DIAGNOSIS — E7800 Pure hypercholesterolemia, unspecified: Secondary | ICD-10-CM | POA: Diagnosis not present

## 2023-03-21 DIAGNOSIS — Z23 Encounter for immunization: Secondary | ICD-10-CM | POA: Diagnosis not present

## 2024-03-26 DIAGNOSIS — E7800 Pure hypercholesterolemia, unspecified: Secondary | ICD-10-CM | POA: Diagnosis not present

## 2024-03-26 DIAGNOSIS — N1831 Chronic kidney disease, stage 3a: Secondary | ICD-10-CM | POA: Diagnosis not present

## 2024-03-26 DIAGNOSIS — Z1331 Encounter for screening for depression: Secondary | ICD-10-CM | POA: Diagnosis not present

## 2024-03-26 DIAGNOSIS — Z0000 Encounter for general adult medical examination without abnormal findings: Secondary | ICD-10-CM | POA: Diagnosis not present

## 2024-04-21 DIAGNOSIS — N1831 Chronic kidney disease, stage 3a: Secondary | ICD-10-CM | POA: Diagnosis not present

## 2024-04-21 DIAGNOSIS — Z872 Personal history of diseases of the skin and subcutaneous tissue: Secondary | ICD-10-CM | POA: Diagnosis not present

## 2024-04-21 DIAGNOSIS — E7800 Pure hypercholesterolemia, unspecified: Secondary | ICD-10-CM | POA: Diagnosis not present
